# Patient Record
Sex: Male | Born: 1965 | Race: White | Hispanic: No | Smoking: Current some day smoker
Health system: Southern US, Community
[De-identification: ages and names within clinical notes are randomized; demographics above are authoritative.]

## PROBLEM LIST (undated history)

## (undated) DIAGNOSIS — M1990 Unspecified osteoarthritis, unspecified site: Secondary | ICD-10-CM

## (undated) DIAGNOSIS — F1910 Other psychoactive substance abuse, uncomplicated: Secondary | ICD-10-CM

## (undated) HISTORY — DX: Other psychoactive substance abuse, uncomplicated: F19.10

## (undated) HISTORY — DX: Unspecified osteoarthritis, unspecified site: M19.90

## (undated) HISTORY — PX: JOINT REPLACEMENT: SHX530

---

## 2014-09-10 ENCOUNTER — Other Ambulatory Visit: Payer: Self-pay | Admitting: *Deleted

## 2014-09-10 ENCOUNTER — Ambulatory Visit
Admission: RE | Admit: 2014-09-10 | Discharge: 2014-09-10 | Disposition: A | Discharge status: Home / Self Care | Payer: BLUE CROSS/BLUE SHIELD | Source: Ambulatory Visit | Attending: *Deleted | Admitting: *Deleted

## 2014-09-10 DIAGNOSIS — W19XXXA Unspecified fall, initial encounter: Secondary | ICD-10-CM

## 2014-10-04 ENCOUNTER — Ambulatory Visit (INDEPENDENT_AMBULATORY_CARE_PROVIDER_SITE_OTHER): Payer: BLUE CROSS/BLUE SHIELD | Admitting: Family Medicine

## 2014-10-04 VITALS — BP 120/78 | HR 90 | Temp 98.1°F | Resp 18 | Ht 66.0 in | Wt 170.0 lb

## 2014-10-04 DIAGNOSIS — S46011A Strain of muscle(s) and tendon(s) of the rotator cuff of right shoulder, initial encounter: Secondary | ICD-10-CM

## 2014-10-04 DIAGNOSIS — J069 Acute upper respiratory infection, unspecified: Secondary | ICD-10-CM

## 2014-10-04 MED ORDER — NAPROXEN 500 MG PO TABS: 500.0000 mg | ORAL_TABLET | Freq: Two times a day (BID) | ORAL | Status: AC

## 2014-10-04 MED ORDER — METHOCARBAMOL 500 MG PO TABS: 500.0000 mg | ORAL_TABLET | Freq: Four times a day (QID) | ORAL | Status: AC | PRN

## 2014-10-04 NOTE — Progress Notes (Signed)
Subjective:  This chart was scribed for Terry SorensonEva Ibraham Levi, MD, by Elon SpannerGarrett Cook, Medical Scribe. This patient was seen in room Rm 3 and the patient's care was started at 3:44 PM.   Patient ID: Terry DaltonWilliam E Adkins, male    DOB: 08/11/1966, 49 y.o.   MRN: [id number]  HPI HPI Comments: Terry DaltonWilliam E Hinderman is a 49 y.o. male who was seen by Dr. Lysbeth PennerWasho for his shoulder pain and had an x-ray on 09/10/14.  Humerus was normal.  He was [facility] Earl[facility]avis [facility] as well who recommended an MRI.   Shoulder x-ray was ordered but not completed, but is visible in his humerus films as is his elbow and proximal forearm.   Patient reports he initially injured his shoulder in late December after slipping down wet steps, falling backwards and catching himself with his elbows bent at 90 degrees.  He reports pain since this time on the anterior aspect of right shoulder and deltoid with some more minor pain on the left shoulder.  Patient reports limited ROM due to pain and states he has never had his pain relieved enough to observe any intrinsic weakness.  Patient reports he [facility] which did not relieve his pain.   Patient has not taken any OTC medications for pain, ice/heat, Beng[place].    Patient denies arm/hand weakness, neck pain.    Patient also complains of a recent, intermittent right-sided occipital headache, subjective fever, chills, dry cough, minor SOB, minor sore throat onset 4 days ago.  Patient denies denies nasal congestion, sinus pressure, ear pain.  Patient is an infrequent smoker.    Patient was recently in [facility] and now [facility], a half-way [facility].    No current outpatient prescriptions on file prior to visit.   No current facility-administered medications on file prior to visit.   Past Medical History  Diagnosis Date  . Arthritis   . Substance abuse    No Known Allergies  Review of Systems     Objective:  BP 120/78  mmHg  Pulse 90  Temp(Src) 98.1 F (36.7 C) (Oral)  Resp 18  Ht 5\' 6"  (1.676 m)  Wt 170 lb (77.111 kg)  BMI 27.45 kg/m2  SpO2 98%  Physical Exam  Constitutional: He is oriented to person, place, and time. He appears well-developed and well-nourished. No distress.  HENT:  Head: Normocephalic and atraumatic.  TM's normal.  Nasal mucosa clear.  Slight bit of purululent rhinorrhea.  Oropharynx erythematous.  Eyes: Conjunctivae and EOM are normal.  Neck: Neck supple. No tracheal deviation present.  No concern of lymphadenopathy.  Thyroid normal.   Cardiovascular: Normal rate and regular rhythm.  Exam reveals no gallop and no friction rub.   No murmur heard. Normal S1/S2  Pulmonary/Chest: Effort normal. No respiratory distress.  Lungs CTA.  Musculoskeletal: Normal range of motion.  FROM with significant pain in the left shoulder with abduction and flexion above 90 degrees.  Negative drop-arm test.  Positive empty can test on right, negative on left.  Significant weakness on external more than internal rotation but pain with resisted external rotation.  Negative Spurling's test.  Positive Hawkin's test.  Negative Neer's.   Neurological: He is alert and oriented to person, place, and time.  Skin: Skin is warm and dry.  Psychiatric: He has a normal mood and affect. His behavior is normal.  Nursing note and vitals reviewed.  Assessment & Plan:  4:00 PM  Rotator cuff strain, right, initial encounter -  Patient advised to follow-up with PT.  If no resolution of shoulder symptoms occurs, patient will be referred to orthopaedist.  Will prescribe anti-inflammatories and muscle relaxant.     Acute upper respiratory infection  Meds ordered this encounter  Medications  . naproxen (NAPROSYN) 500 MG tablet    Sig: Take 1 tablet (500 mg total) by mouth 2 (two) times daily with a meal.    Dispense:  60 tablet    Refill:  1  . methocarbamol (ROBAXIN) 500 MG tablet    Sig: Take 1 tablet  (500 mg total) by mouth every 6 (six) hours as needed for muscle spasms.    Dispense:  40 tablet    Refill:  0    I personally performed the services described in this documentation, which was scribed in my presence. The recorded information has been reviewed and considered, and addended by me as needed.  Terry Sorenson, MD MPH

## 2014-10-04 NOTE — Patient Instructions (Addendum)
Rotator Cuff Injury Rotator cuff injury is any type of injury to the set of muscles and tendons that make up the stabilizing unit of your shoulder. This unit holds the ball of your upper arm bone (humerus) in the socket of your shoulder blade (scapula).  CAUSES Injuries to your rotator cuff most commonly come from sports or activities that cause your arm to be moved repeatedly over your head. Examples of this include throwing, weight lifting, swimming, or racquet sports. Long lasting (chronic) irritation of your rotator cuff can cause soreness and swelling (inflammation), bursitis, and eventual damage to your tendons, such as a tear (rupture). SIGNS AND SYMPTOMS Acute rotator cuff tear:  Sudden tearing sensation followed by severe pain shooting from your upper shoulder down your arm toward your elbow.  Decreased range of motion of your shoulder because of pain and muscle spasm.  Severe pain.  Inability to raise your arm out to the side because of pain and loss of muscle power (large tears). Chronic rotator cuff tear:  Pain that usually is worse at night and may interfere with sleep.  Gradual weakness and decreased shoulder motion as the pain worsens.  Decreased range of motion. Rotator cuff tendinitis:  Deep ache in your shoulder and the outside upper arm over your shoulder.  Pain that comes on gradually and becomes worse when lifting your arm to the side or turning it inward. DIAGNOSIS Rotator cuff injury is diagnosed through a medical history, physical exam, and imaging exam. The medical history helps determine the type of rotator cuff injury. Your health care provider will look at your injured shoulder, feel the injured area, and ask you to move your shoulder in different positions. X-ray exams typically are done to rule out other causes of shoulder pain, such as fractures. MRI is the exam of choice for the most severe shoulder injuries because the images show muscles and tendons.    TREATMENT  Chronic tear:  Medicine for pain, such as acetaminophen or ibuprofen.  Physical therapy and range-of-motion exercises may be helpful in maintaining shoulder function and strength.  Steroid injections into your shoulder joint.  Surgical repair of the rotator cuff if the injury does not heal with noninvasive treatment. Acute tear:  Anti-inflammatory medicines such as ibuprofen and naproxen to help reduce pain and swelling.  A sling to help support your arm and rest your rotator cuff muscles. Long-term use of a sling is not advised. It may cause significant stiffening of the shoulder joint.  Surgery may be considered within a few weeks, especially in younger, active people, to return the shoulder to full function.  Indications for surgical treatment include the following:  Age younger than 60 years.  Rotator cuff tears that are complete.  Physical therapy, rest, and anti-inflammatory medicines have been used for 6-8 weeks, with no improvement.  Employment or sporting activity that requires constant shoulder use. Tendinitis:  Anti-inflammatory medicines such as ibuprofen and naproxen to help reduce pain and swelling.  A sling to help support your arm and rest your rotator cuff muscles. Long-term use of a sling is not advised. It may cause significant stiffening of the shoulder joint.  Severe tendinitis may require:  Steroid injections into your shoulder joint.  Physical therapy.  Surgery. HOME CARE INSTRUCTIONS   Apply ice to your injury:  Put ice in a plastic bag.  Place a towel between your skin and the bag.  Leave the ice on for 20 minutes, 2-3 times a day.  If you   have a shoulder immobilizer (sling and straps), wear it until told otherwise by your health care provider.  You may want to sleep on several pillows or in a recliner at night to lessen swelling and pain.  Only take over-the-counter or prescription medicines for pain, discomfort, or fever as  directed by your health care provider.  Do simple hand squeezing exercises with a soft rubber ball to decrease hand swelling. SEEK MEDICAL CARE IF:   Your shoulder pain increases, or new pain or numbness develops in your arm, hand, or fingers.  Your hand or fingers are colder than your other hand. SEEK IMMEDIATE MEDICAL CARE IF:   Your arm, hand, or fingers are numb or tingling.  Your arm, hand, or fingers are increasingly swollen and painful, or they turn white or blue. MAKE SURE YOU:  Understand these instructions.  Will watch your condition.  Will get help right away if you are not doing well or get worse. Document Released: 08/19/2000 Document Revised: 08/27/2013 Document Reviewed: 04/03/2013 [facility] Patient Information 2015 [place], [place]. This information is not intended to replace advice given to you by your health care provider. Make sure you discuss any questions you have with your health care provider.  Rotator Cuff Tear The rotator cuff is four tendons that assist in the motion of the shoulder. A rotator cuff tear is a tear in one of these four tendons. It is characterized by pain and weakness of the shoulder. The rotator cuff tendons surround the shoulder ball and socket joint (humeral head). The rotator cuff tendons attach to the shoulder blade (scapula) on one side and the upper arm bone (humerus) on the other side. The rotator cuff is essential for shoulder stability and shoulder motion. SYMPTOMS   Pain around the shoulder, often at the outer portion of the upper arm.  Pain that is worse with shoulder function, especially when reaching overhead or lifting.  Weakness of the shoulder muscles.  Aching when not using your arm; often, pain awakens you at night, especially when sleeping on the affected side.  Tenderness, swelling, warmth, or redness over the outer aspect of the shoulder.  Loss of strength.  Limited motion of the shoulder, especially reaching behind  (reaching into one's back pocket) or across your body.  A crackling sound (crepitation) when moving the shoulder.  Biceps tendon pain (in the front of the shoulder) and inflammation, worse with bending the elbow or lifting. CAUSES   Strain from sudden increase in amount or intensity of activity.  Direct blow or injury to the shoulder.  Aging, wear from from normal use.  Roof of the shoulder (acromial) spur. RISK INCREASES WITH:   Contact sports (football, wrestling, or boxing).  Throwing or hitting sports (baseball, tennis, or volleyball).  Weightlifting and bodybuilding.  Heavy labor.  Previous injury to rotator cuff.  Failure to warm up properly before activity.  Inadequate protective equipment.  Increasing age.  Spurring of the outer end of the scapula (acromion).  Cortisone injections.  Poor shoulder strength and flexibility. PREVENTION  Warm up and stretch properly before activity.  Allow time for rest and recovery between practices and competition.  Maintain physical fitness:  Cardiovascular fitness.  Shoulder flexibility.  Strength and endurance of the rotator cuff muscles and muscles of the shoulder blade.  Learn and use proper technique when throwing or hitting. PROGNOSIS Surgery is often needed. Although, symptoms may go away by themselves. RELATED COMPLICATIONS   Persistent pain that may progress to constant pain.  Shoulder  stiffness, frozen shoulder syndrome, or loss of motion.  Recurrence of symptoms, especially if treated without surgery.  Inability to return to same level of sports, even with surgery.  Persistent weakness.  Risks of surgery, including infection, bleeding, injury to nerves, shoulder stiffness, weakness, re-tearing of the rotator cuff tendon.  Deltoid detachment, acromial fracture, and persistent pain. TREATMENT Treatment involves the use of ice and medicine to reduce pain and inflammation. Strengthening and  stretching exercise are usually recommended. These exercises may be completed at home or with a therapist. You may also be instructed to modify offending activities. Corticosteroid injections may be given to reduce inflammation. Surgery is usually recommended for athletes. Surgery has the best chance for a full recovery. Surgery involves:  Removal of an inflamed bursa.  Removal of an acromial spur if present.  Suturing the torn tendon back together. Rotator cuff surgeries may be preformed either arthroscopically or through an open incision. Recovery typically takes 6 to 12 months. MEDICATION  If pain medicine is necessary, then nonsteroidal anti-inflammatory medicines, such as aspirin and ibuprofen, or other minor pain relievers, such as acetaminophen, are often recommended.  Do not take pain medicine for 7 days before surgery.  Prescription pain relievers are usually only prescribed after surgery. Use only as directed and only as much as you need.  Corticosteroid injections may be given to reduce inflammation. However, there is a limited number of times the joint may be injected with these medicines. HEAT AND COLD  Cold treatment (icing) relieves pain and reduces inflammation. Cold treatment should be applied for 10 to 15 minutes every 2 to 3 hours for inflammation and pain and immediately after any activity that aggravates your symptoms. Use ice packs or massage the area with a piece of ice (ice massage).  Heat treatment may be used prior to performing the stretching and strengthening activities prescribed by your caregiver, physical therapist, or athletic trainer. Use a heat pack or soak the injury in warm water. SEEK MEDICAL CARE IF:   Symptoms get worse or do not improve in 4 to 6 weeks despite treatment.  You experience pain, numbness, or coldness in the hand.  Blue, gray, or dark color appears in the fingernails.  New, unexplained symptoms develop (drugs used in treatment may  produce side effects). Document Released: 08/22/2005 Document Revised: 11/14/2011 Document Reviewed: 12/04/2008 [facility] Patient Information 2015 [place], [place]. This information is not intended to replace advice given to you by your health care provider. Make sure you discuss any questions you have with your health care provider.

## 2016-04-26 IMAGING — CR DG HUMERUS 2V *R*
2 series · 2 of 2 positions shown · non-contrast
Comparison: None.

CLINICAL DATA: Fall downstairs 10 days previous with upper arm
pain, initial encounter

EXAM:
RIGHT HUMERUS - 2+ VIEW

[w humerus ap right *]
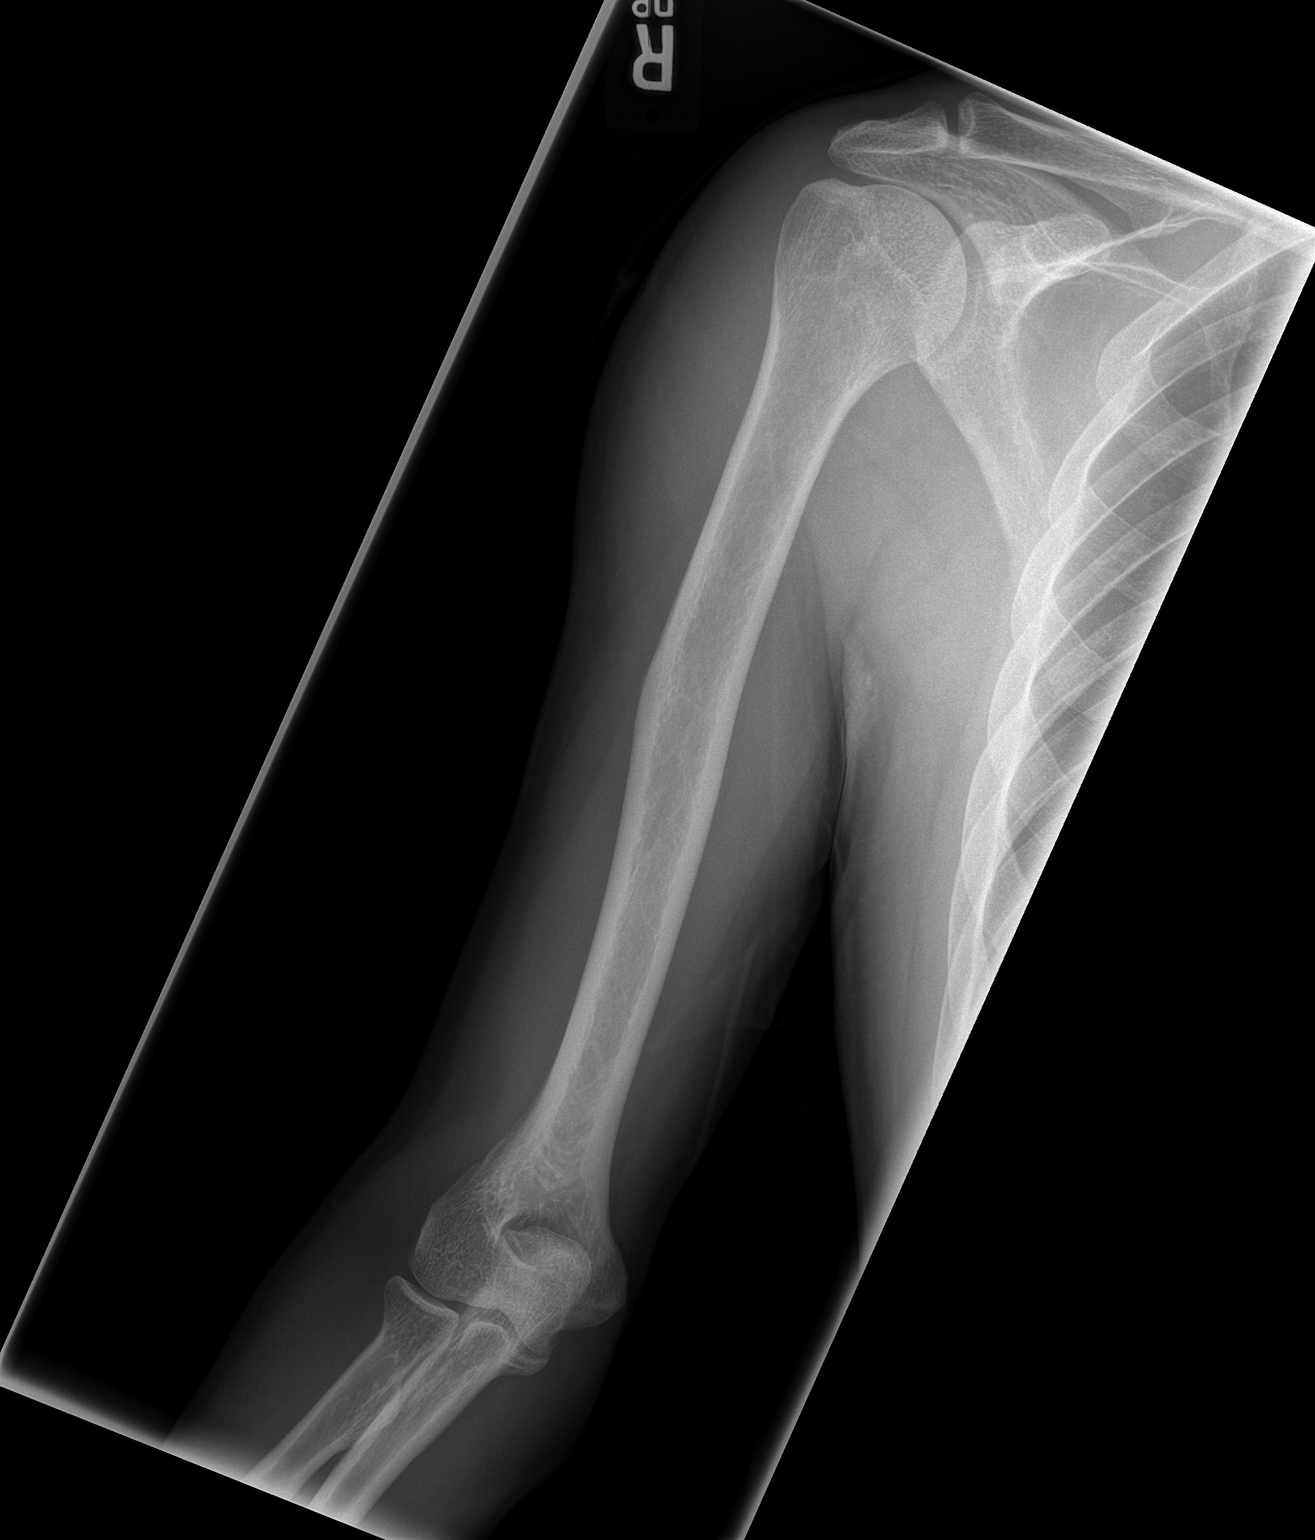

[w humerus lat right *]
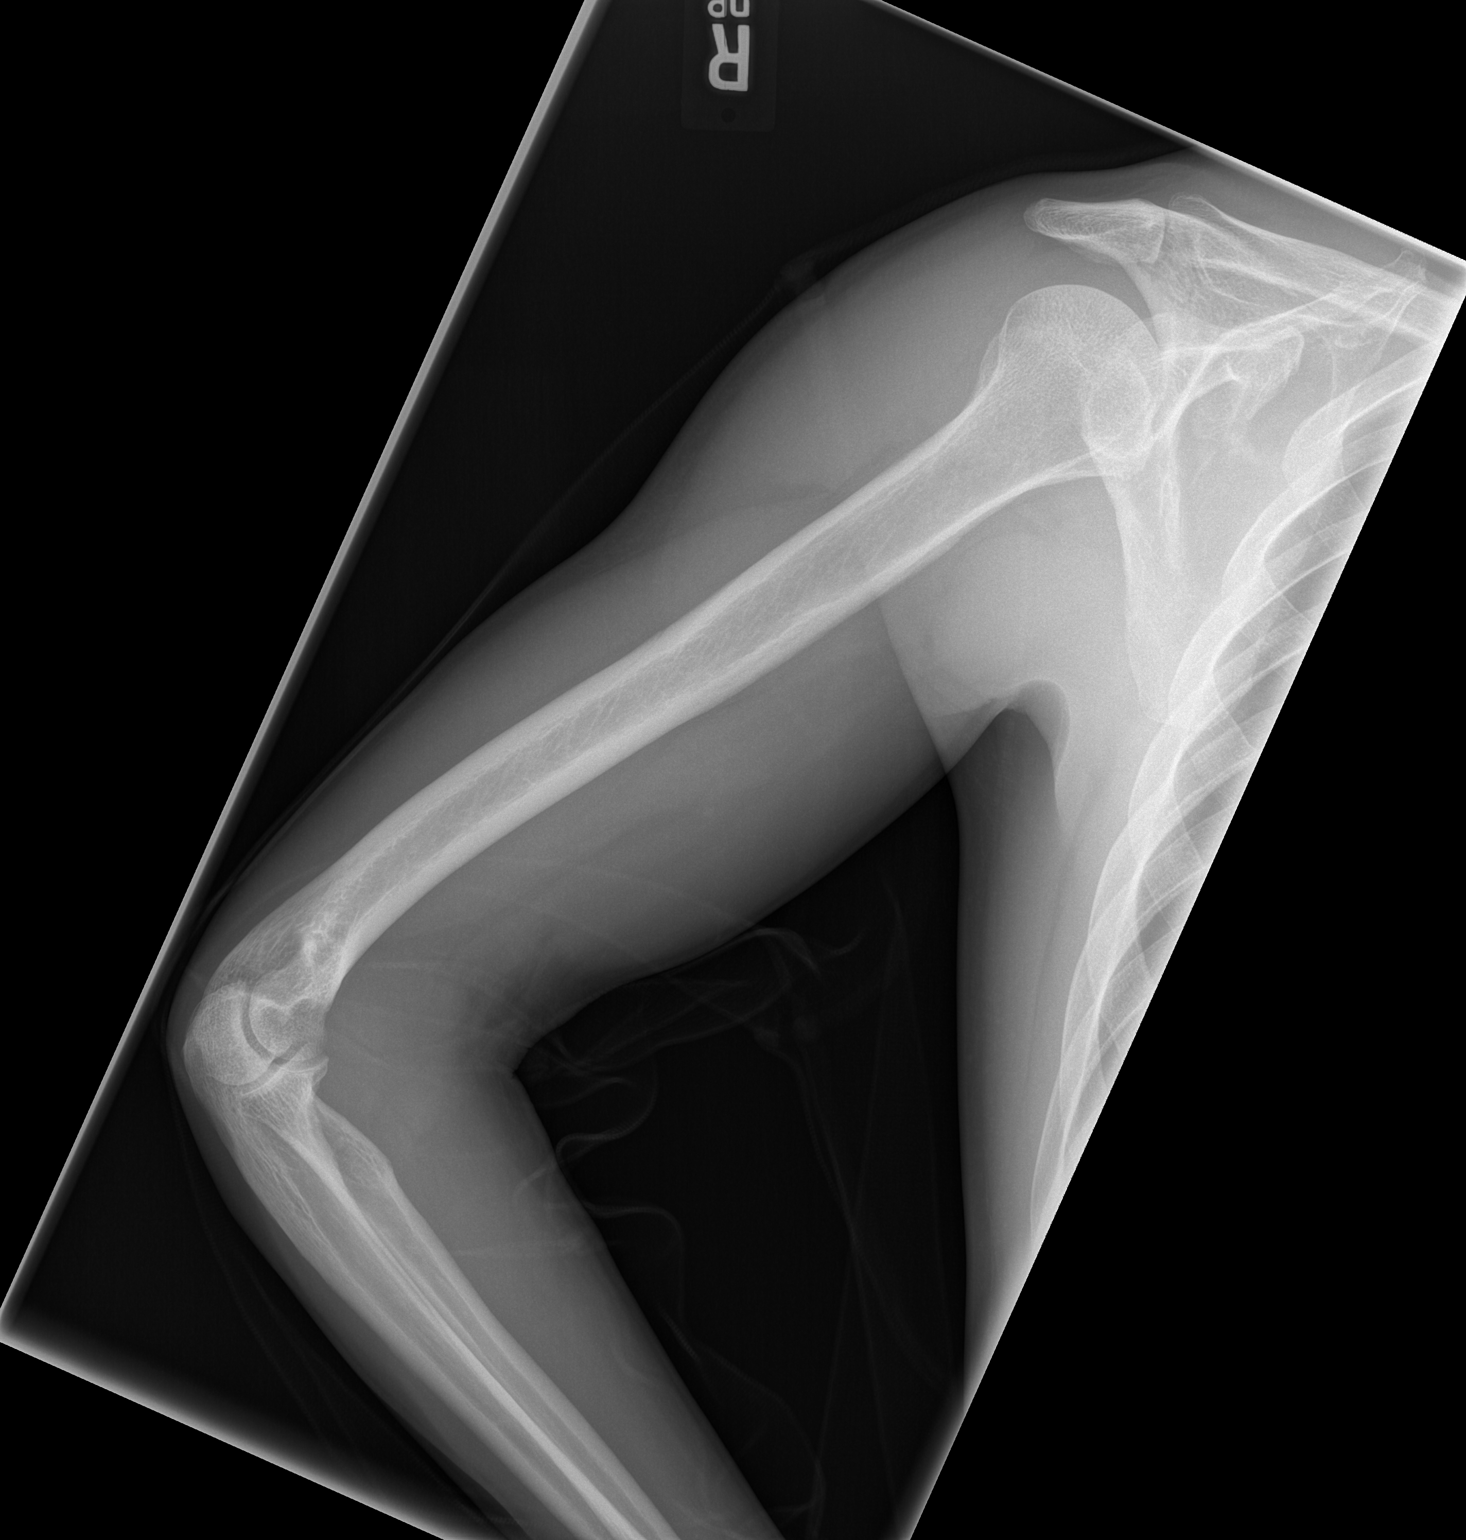

[2 of 2 positions shown; findings below may reference images not displayed]

FINDINGS: There is no evidence of fracture or other focal bone lesions. Soft
tissues are unremarkable.
IMPRESSION: No acute abnormality noted.
